# Patient Record
Sex: Female | Born: 1975
Health system: Southern US, Community
[De-identification: ages and names within clinical notes are randomized; demographics above are authoritative.]

## PROBLEM LIST (undated history)

## (undated) DIAGNOSIS — T7840XA Allergy, unspecified, initial encounter: Secondary | ICD-10-CM

## (undated) HISTORY — DX: Allergy, unspecified, initial encounter: T78.40XA

---

## 1998-10-15 ENCOUNTER — Emergency Department (HOSPITAL_COMMUNITY): Admission: EM | Admit: 1998-10-15 | Discharge: 1998-10-15 | Payer: Self-pay | Admitting: Emergency Medicine

## 1999-01-24 ENCOUNTER — Emergency Department (HOSPITAL_COMMUNITY): Admission: EM | Admit: 1999-01-24 | Discharge: 1999-01-24 | Payer: Self-pay | Admitting: Emergency Medicine

## 2001-03-22 ENCOUNTER — Emergency Department (HOSPITAL_COMMUNITY): Admission: EM | Admit: 2001-03-22 | Discharge: 2001-03-22 | Payer: Self-pay | Admitting: Emergency Medicine

## 2001-03-22 ENCOUNTER — Encounter: Payer: Self-pay | Admitting: Emergency Medicine

## 2008-05-11 ENCOUNTER — Ambulatory Visit: Payer: Self-pay | Admitting: Family Medicine

## 2010-02-20 ENCOUNTER — Ambulatory Visit: Payer: Self-pay | Admitting: Family Medicine

## 2010-03-05 ENCOUNTER — Ambulatory Visit: Payer: Self-pay | Admitting: Family Medicine

## 2010-03-05 ENCOUNTER — Other Ambulatory Visit: Admission: RE | Admit: 2010-03-05 | Discharge: 2010-03-05 | Payer: Self-pay | Admitting: Family Medicine

## 2010-05-05 LAB — HM PAP SMEAR: HM Pap smear: NEGATIVE

## 2011-06-10 ENCOUNTER — Encounter: Payer: Self-pay | Admitting: Family Medicine

## 2015-11-13 ENCOUNTER — Encounter: Payer: Self-pay | Admitting: Family Medicine

## 2015-11-13 ENCOUNTER — Ambulatory Visit (INDEPENDENT_AMBULATORY_CARE_PROVIDER_SITE_OTHER): Payer: 59 | Admitting: Family Medicine

## 2015-11-13 VITALS — BP 122/80 | HR 84 | Resp 14 | Wt 127.0 lb

## 2015-11-13 DIAGNOSIS — R1084 Generalized abdominal pain: Secondary | ICD-10-CM | POA: Diagnosis not present

## 2015-11-13 LAB — POCT URINALYSIS DIPSTICK
Bilirubin, UA: NEGATIVE
Glucose, UA: NEGATIVE
Ketones, UA: NEGATIVE
Leukocytes, UA: NEGATIVE
Nitrite, UA: NEGATIVE
Protein, UA: NEGATIVE
Spec Grav, UA: >=1.03
Urobilinogen, UA: NEGATIVE
pH, UA: 6

## 2015-11-13 NOTE — Progress Notes (Signed)
   Subjective:    Patient ID: [facility], female    DOB: 08/02/1976, 39 y.o.   MRN: [id number]  HPI She complains of a one-month history of intermittent lower abdominal pain that in the last 2 days as gotten much worse. The pain lasts for several minutes and then goes away. She has had no nausea, vomiting, diarrhea or constipation. Her menses has been normal. She's had no urgency or frequency.   Review of Systems     Objective:   Physical Exam Alert and in no distress. Abdominal exam shows active bowel sounds without masses or tenderness. Urinalysis did show slight amount of RBCs however she recently finished her cycle.       Assessment & Plan:  Generalized abdominal pain - Plan: POCT urinalysis dipstick  I explained that at this time there is no reason behind her pain. Recommend 2 Aleve twice per day for the next week to see if this will help him pay attention to her symptom complex.

## 2015-11-13 NOTE — Patient Instructions (Signed)
Take 2 Aleve twice a day regularly for the next week. Attention to what when and where and why

## 2017-06-16 ENCOUNTER — Ambulatory Visit (INDEPENDENT_AMBULATORY_CARE_PROVIDER_SITE_OTHER): Payer: 59 | Admitting: Family Medicine

## 2017-06-16 ENCOUNTER — Encounter: Payer: Self-pay | Admitting: Family Medicine

## 2017-06-16 VITALS — BP 120/76 | HR 100 | Wt 116.0 lb

## 2017-06-16 DIAGNOSIS — K148 Other diseases of tongue: Secondary | ICD-10-CM | POA: Diagnosis not present

## 2017-06-16 DIAGNOSIS — L309 Dermatitis, unspecified: Secondary | ICD-10-CM | POA: Diagnosis not present

## 2017-06-16 DIAGNOSIS — J309 Allergic rhinitis, unspecified: Secondary | ICD-10-CM | POA: Diagnosis not present

## 2017-06-16 NOTE — Progress Notes (Signed)
   Subjective:    Patient ID: [facility]ri [facility], female    DOB: 1976/08/23, 41 y.o.   MRN: [id number]  HPI She is here for evaluation of dry patches on her elbows and torso. She has a previous history of eczema but this appears differently. She also has a history of allergic rhinitis. She also has a lesion present on her tongue that tends to come and go. His causing her no difficulty.   Review of Systems     Objective:   Physical Exam Alert and in no distress. Exam of the tongue does show a whitish 2-3 mm lesion on the midportion of the tongue. No other lesions noted. Exam of her elbows in the antecubital fossa and also over the elbow does show some pigment changes. She also has round slightly pigmented areas on her torso.      Assessment & Plan:  Eczema, unspecified type  Allergic rhinitis, unspecified seasonality, unspecified trigger  Tongue lesion I explained that I think the lesions are eczema and recommended treating with cortisone cream aggressively initially and then backing off to keep it under control. She is comfortable with this approach. I stated that since the tongue lesion tends to come and go much less concerned did mention possibly seeing her dentist concerning his thoughts.

## 2018-07-25 ENCOUNTER — Ambulatory Visit: Payer: 59 | Admitting: Family Medicine

## 2018-07-25 ENCOUNTER — Other Ambulatory Visit (HOSPITAL_COMMUNITY)
Admission: RE | Admit: 2018-07-25 | Discharge: 2018-07-25 | Disposition: A | Discharge status: Home / Self Care | Payer: 59 | Source: Ambulatory Visit | Attending: Family Medicine | Admitting: Family Medicine

## 2018-07-25 ENCOUNTER — Encounter: Payer: Self-pay | Admitting: Family Medicine

## 2018-07-25 VITALS — BP 140/70 | HR 102 | Temp 98.1°F | Wt 122.0 lb

## 2018-07-25 DIAGNOSIS — J309 Allergic rhinitis, unspecified: Secondary | ICD-10-CM | POA: Diagnosis not present

## 2018-07-25 DIAGNOSIS — Z124 Encounter for screening for malignant neoplasm of cervix: Secondary | ICD-10-CM

## 2018-07-25 DIAGNOSIS — Z0000 Encounter for general adult medical examination without abnormal findings: Secondary | ICD-10-CM

## 2018-07-25 DIAGNOSIS — Z23 Encounter for immunization: Secondary | ICD-10-CM

## 2018-07-25 DIAGNOSIS — L309 Dermatitis, unspecified: Secondary | ICD-10-CM

## 2018-07-25 DIAGNOSIS — Z1239 Encounter for other screening for malignant neoplasm of breast: Secondary | ICD-10-CM

## 2018-07-25 DIAGNOSIS — Z1231 Encounter for screening mammogram for malignant neoplasm of breast: Secondary | ICD-10-CM

## 2018-07-25 NOTE — Progress Notes (Signed)
   Subjective:    Patient ID: Terri Mahoney, female    DOB: [birth year], 42 y.o.   MRN: [id number]  HPI She is here for complete examination.  She does have eczema and takes care of that with OTC medications.  She also has allergies and again is using OTC meds for that.  She has no other concerns or complaints.  Plan social history as well as health maintenance and immunizations was reviewed.  She is not involved in a regular exercise program.  Her work is going well.  Presently she is not in a relationship.   Review of Systems  All other systems reviewed and are negative.      Objective:   Physical Exam BP 140/70 (BP Location: Left Arm, Patient Position: Sitting)   Pulse (!) 102   Temp 98.1 F (36.7 C)   Wt 122 lb (55.3 kg)   SpO2 99%   General Appearance:    Alert, cooperative, no distress, appears stated age  Head:    Normocephalic, without obvious abnormality, atraumatic  Eyes:    PERRL, conjunctiva/corneas clear, EOM's intact, fundi    benign  Ears:    Normal TM's and external ear canals  Nose:   Nares normal, mucosa normal, no drainage or sinus   tenderness  Throat:   Lips, mucosa, and tongue normal; teeth and gums normal  Neck:   Supple, no lymphadenopathy;  thyroid:  no   enlargement/tenderness/nodules; no carotid   bruit or JVD     Lungs:     Clear to auscultation bilaterally without wheezes, rales or     ronchi; respirations unlabored      Heart:    Regular rate and rhythm, S1 and S2 normal, no murmur, rub   or gallop  Breast Exam:   Deferred  Abdomen:     Soft, non-tender, nondistended, normoactive bowel sounds,    no masses, no hepatosplenomegaly  Genitalia:    Normal external genitalia without lesions.  BUS and vagina normal; cervix was not visualized, or cervical motion tenderness. No abnormal vaginal discharge.  Uterus and adnexa not enlarged, nontender, no masses.  Hymen intact pap performed  Rectal:   Deferred  Extremities:   No clubbing, cyanosis or edema   Pulses:   2+ and symmetric all extremities  Skin:   Skin color, texture, turgor normal, no rashes or lesions  Lymph nodes:   Cervical, supraclavicular, and axillary nodes normal  Neurologic:   CNII-XII intact, normal strength, sensation and gait; reflexes 2+ and symmetric throughout          Psych:   Normal mood, affect, hygiene and grooming.         Assessment & Plan:  Routine general medical examination at a health care facility - Plan: CBC with Differential/Platelet, Comprehensive metabolic panel, Lipid panel  Eczema, unspecified type  Allergic rhinitis, unspecified seasonality, unspecified trigger  Need for Tdap vaccination - Plan: Tdap vaccine greater than or equal to 7yo IM  Screening for cervical cancer - Plan: Cytology - PAP([facility])  Screening for breast cancer - Plan: MM DIGITAL SCREENING BILATERAL I encouraged her to continue to take good care of herself.

## 2018-07-26 LAB — COMPREHENSIVE METABOLIC PANEL
ALT: 11 IU/L (ref 0–32)
AST: 17 IU/L (ref 0–40)
Albumin/Globulin Ratio: 1.4 (ref 1.2–2.2)
Albumin: 4.4 g/dL (ref 3.5–5.5)
Alkaline Phosphatase: 45 IU/L (ref 39–117)
BUN/Creatinine Ratio: 15 (ref 9–23)
BUN: 12 mg/dL (ref 6–24)
Bilirubin Total: 0.2 mg/dL (ref 0.0–1.2)
CO2: 24 mmol/L (ref 20–29)
Calcium: 9.4 mg/dL (ref 8.7–10.2)
Chloride: 101 mmol/L (ref 96–106)
Creatinine, Ser: 0.79 mg/dL (ref 0.57–1.00)
GFR calc Af Amer: 108 mL/min/{1.73_m2} (ref >59)
GFR calc non Af Amer: 93 mL/min/{1.73_m2} (ref >59)
Globulin, Total: 3.1 g/dL (ref 1.5–4.5)
Glucose: 106 mg/dL — ABNORMAL HIGH (ref 65–99)
Potassium: 4.8 mmol/L (ref 3.5–5.2)
Sodium: 140 mmol/L (ref 134–144)
Total Protein: 7.5 g/dL (ref 6.0–8.5)

## 2018-07-26 LAB — CBC WITH DIFFERENTIAL/PLATELET
Basophils Absolute: 0 10*3/uL (ref 0.0–0.2)
Basos: 1 %
EOS (ABSOLUTE): 0.1 10*3/uL (ref 0.0–0.4)
Eos: 1 %
Hematocrit: 37.8 % (ref 34.0–46.6)
Hemoglobin: 12.4 g/dL (ref 11.1–15.9)
Immature Grans (Abs): 0 10*3/uL (ref 0.0–0.1)
Immature Granulocytes: 0 %
Lymphocytes Absolute: 1.4 10*3/uL (ref 0.7–3.1)
Lymphs: 21 %
MCH: 25.7 pg — ABNORMAL LOW (ref 26.6–33.0)
MCHC: 32.8 g/dL (ref 31.5–35.7)
MCV: 78 fL — ABNORMAL LOW (ref 79–97)
Monocytes Absolute: 0.5 10*3/uL (ref 0.1–0.9)
Monocytes: 7 %
Neutrophils Absolute: 4.7 10*3/uL (ref 1.4–7.0)
Neutrophils: 70 %
Platelets: 323 10*3/uL (ref 150–450)
RBC: 4.82 x10E6/uL (ref 3.77–5.28)
RDW: 15.7 % — ABNORMAL HIGH (ref 12.3–15.4)
WBC: 6.6 10*3/uL (ref 3.4–10.8)

## 2018-07-26 LAB — LIPID PANEL
Chol/HDL Ratio: 3.1 ratio (ref 0.0–4.4)
Cholesterol, Total: 209 mg/dL — ABNORMAL HIGH (ref 100–199)
HDL: 67 mg/dL (ref >39)
LDL Calculated: 121 mg/dL — ABNORMAL HIGH (ref 0–99)
Triglycerides: 106 mg/dL (ref 0–149)
VLDL Cholesterol Cal: 21 mg/dL (ref 5–40)

## 2018-07-27 LAB — CYTOLOGY - PAP
Adequacy: ABSENT
Diagnosis: NEGATIVE

## 2019-07-26 ENCOUNTER — Encounter: Payer: 59 | Admitting: Family Medicine

## 2020-09-17 ENCOUNTER — Other Ambulatory Visit: Payer: Self-pay

## 2020-09-17 ENCOUNTER — Encounter: Payer: Self-pay | Admitting: Family Medicine

## 2020-09-17 ENCOUNTER — Ambulatory Visit: Payer: 59 | Admitting: Family Medicine

## 2020-09-17 VITALS — BP 118/76 | HR 91 | Temp 98.2°F | Ht 62.0 in | Wt 116.4 lb

## 2020-09-17 DIAGNOSIS — R1033 Periumbilical pain: Secondary | ICD-10-CM

## 2020-09-17 NOTE — Progress Notes (Signed)
° °  Subjective:    Patient ID: Terri Mahoney, female    DOB: 08-Mar-1976, 44 y.o.   MRN: [id number]  HPI She complains of a 3-week history of intermittent periumbilical burning sensation with nausea, weakness but no vomiting, black tarry stools.  No difficulty with this at night.  Food makes questionable difference with this.  She has not tried any medications for this.   Review of Systems     Objective:   Physical Exam Alert and in no distress.  Cardiac exam shows regular rhythm without murmurs or gallops.  Lungs are clear to auscultation.  Abdominal exam shows slight periumbilical discomfort but no rebound with normal bowel sounds.       Assessment & Plan:  Periumbilical abdominal pain Take 2 Prilosec daily for the next couple of weeks and pay attention to what, when, where, why.  Come back in a month if you need to

## 2020-09-17 NOTE — Patient Instructions (Signed)
Take 2 Prilosec daily for the next couple of weeks and pay attention to what, when, where, why.  Come back in a month if you need to

## 2020-12-10 ENCOUNTER — Encounter: Payer: 59 | Admitting: Family Medicine

## 2020-12-31 ENCOUNTER — Emergency Department (HOSPITAL_COMMUNITY)
Admission: EM | Admit: 2020-12-31 | Discharge: 2021-01-01 | Disposition: A | Discharge status: Home / Self Care | Payer: 59 | Attending: Emergency Medicine | Admitting: Emergency Medicine

## 2020-12-31 ENCOUNTER — Other Ambulatory Visit: Payer: Self-pay

## 2020-12-31 DIAGNOSIS — K921 Melena: Secondary | ICD-10-CM | POA: Diagnosis not present

## 2020-12-31 DIAGNOSIS — Z5321 Procedure and treatment not carried out due to patient leaving prior to being seen by health care provider: Secondary | ICD-10-CM | POA: Diagnosis not present

## 2020-12-31 NOTE — ED Triage Notes (Signed)
Pt states tonight she noticed BRBPR, blood in stool, blood on TP when she wiped. States she has not eaten anything to precipitate symptoms, does not know of any exacerbating factors, but has felt weak off & on for past couple months, unsure if related to menapause or something more serious.

## 2021-01-01 LAB — URINALYSIS, ROUTINE W REFLEX MICROSCOPIC
Bilirubin Urine: NEGATIVE
Glucose, UA: NEGATIVE mg/dL
Ketones, ur: NEGATIVE mg/dL
Nitrite: NEGATIVE
Protein, ur: NEGATIVE mg/dL
Specific Gravity, Urine: 1.009 (ref 1.005–1.030)
pH: 7 (ref 5.0–8.0)

## 2021-01-01 LAB — COMPREHENSIVE METABOLIC PANEL
ALT: 15 U/L (ref 0–44)
AST: 20 U/L (ref 15–41)
Albumin: 4 g/dL (ref 3.5–5.0)
Alkaline Phosphatase: 39 U/L (ref 38–126)
Anion gap: 12 (ref 5–15)
BUN: 14 mg/dL (ref 6–20)
CO2: 23 mmol/L (ref 22–32)
Calcium: 9.2 mg/dL (ref 8.9–10.3)
Chloride: 102 mmol/L (ref 98–111)
Creatinine, Ser: 0.82 mg/dL (ref 0.44–1.00)
GFR, Estimated: >60 mL/min (ref >60)
Glucose, Bld: 118 mg/dL — ABNORMAL HIGH (ref 70–99)
Potassium: 3.4 mmol/L — ABNORMAL LOW (ref 3.5–5.1)
Sodium: 137 mmol/L (ref 135–145)
Total Bilirubin: 0.6 mg/dL (ref 0.3–1.2)
Total Protein: 7.1 g/dL (ref 6.5–8.1)

## 2021-01-01 LAB — CBC
HCT: 37.5 % (ref 36.0–46.0)
Hemoglobin: 12 g/dL (ref 12.0–15.0)
MCH: 25.6 pg — ABNORMAL LOW (ref 26.0–34.0)
MCHC: 32 g/dL (ref 30.0–36.0)
MCV: 80 fL (ref 80.0–100.0)
Platelets: 266 10*3/uL (ref 150–400)
RBC: 4.69 MIL/uL (ref 3.87–5.11)
RDW: 14.6 % (ref 11.5–15.5)
WBC: 6.9 10*3/uL (ref 4.0–10.5)
nRBC: 0 % (ref 0.0–0.2)

## 2021-01-01 LAB — LIPASE, BLOOD: Lipase: 57 U/L — ABNORMAL HIGH (ref 11–51)

## 2021-01-02 ENCOUNTER — Ambulatory Visit: Payer: 59 | Admitting: Family Medicine

## 2021-01-02 ENCOUNTER — Encounter: Payer: Self-pay | Admitting: Family Medicine

## 2021-01-02 ENCOUNTER — Other Ambulatory Visit: Payer: Self-pay

## 2021-01-02 VITALS — BP 110/78 | HR 94 | Temp 96.9°F | Wt 116.2 lb

## 2021-01-02 DIAGNOSIS — R300 Dysuria: Secondary | ICD-10-CM | POA: Diagnosis not present

## 2021-01-02 DIAGNOSIS — J029 Acute pharyngitis, unspecified: Secondary | ICD-10-CM | POA: Diagnosis not present

## 2021-01-02 DIAGNOSIS — N3001 Acute cystitis with hematuria: Secondary | ICD-10-CM

## 2021-01-02 LAB — POCT URINALYSIS DIP (PROADVANTAGE DEVICE)
Bilirubin, UA: NEGATIVE
Glucose, UA: NEGATIVE mg/dL
Ketones, POC UA: NEGATIVE mg/dL
Nitrite, UA: NEGATIVE
Protein Ur, POC: 30 mg/dL — AB
Specific Gravity, Urine: 1.025
Urobilinogen, Ur: 0.2
pH, UA: 6 (ref 5.0–8.0)

## 2021-01-02 MED ORDER — SULFAMETHOXAZOLE-TRIMETHOPRIM 800-160 MG PO TABS: 1.0000 | ORAL_TABLET | Freq: Two times a day (BID) | ORAL | 0 refills | Status: DC

## 2021-01-02 NOTE — Progress Notes (Signed)
   Subjective:    Patient ID: Terri Mahoney, female    DOB: 09/15/1976, 45 y.o.   MRN: [id number]  HPI She is here for follow-up after recently being seen in the emergency room.  She initially noted blood in her stool however several hours after seeing that, she started her menstrual cycle.  She has been regular with that.  She has occasionally noted difficulty with what she calls hot flashes.  She has noted a decrease in the flow and number of days.  She has noted difficulty with dysuria and some urgency and notes this with the last several cycles.  She also has had difficulty with a sore throat for the last several months that is been intermittent in nature, no fever no chills no history of allergies.   Review of Systems     Objective:   Physical Exam Alert and in no distress. Tympanic membranes and canals are normal. Pharyngeal area is normal. Neck is supple without adenopathy or thyromegaly. Cardiac exam shows a regular sinus rhythm without murmurs or gallops. Lungs are clear to auscultation. Urine dipstick showed blood as well as leukocytes.       Assessment & Plan:  Acute cystitis with hematuria - Plan: sulfamethoxazole-trimethoprim (BACTRIM DS) 800-160 MG tablet  Burning with urination - Plan: POCT Urinalysis DIP (Proadvantage Device)  Sore throat I explained that I thought she could potentially have a UTI and will treat her since she is having the dysuria. I then discussed menopause and perimenopausal symptoms.  She will return in May for complete exam and Pap. Discussed a sore throat since she is really not having any other symptoms recommended watchful waiting.  Can refer to ENT if she so desires.

## 2021-01-16 ENCOUNTER — Ambulatory Visit: Payer: 59 | Admitting: Family Medicine

## 2021-03-25 ENCOUNTER — Ambulatory Visit: Payer: 59 | Admitting: Family Medicine

## 2021-03-25 ENCOUNTER — Encounter: Payer: Self-pay | Admitting: Family Medicine

## 2021-03-25 ENCOUNTER — Other Ambulatory Visit: Payer: Self-pay

## 2021-03-25 VITALS — BP 122/74 | HR 93 | Temp 98.2°F | Ht 61.0 in | Wt 112.6 lb

## 2021-03-25 DIAGNOSIS — Z0000 Encounter for general adult medical examination without abnormal findings: Secondary | ICD-10-CM

## 2021-03-25 DIAGNOSIS — Z1159 Encounter for screening for other viral diseases: Secondary | ICD-10-CM | POA: Diagnosis not present

## 2021-03-25 DIAGNOSIS — L309 Dermatitis, unspecified: Secondary | ICD-10-CM

## 2021-03-25 DIAGNOSIS — J309 Allergic rhinitis, unspecified: Secondary | ICD-10-CM

## 2021-03-25 DIAGNOSIS — Z1231 Encounter for screening mammogram for malignant neoplasm of breast: Secondary | ICD-10-CM

## 2021-03-25 NOTE — Progress Notes (Signed)
   Subjective:    Patient ID: Terri Mahoney, female    DOB: 04-18-1976, 46 y.o.   MRN: [id number]  HPI She is here for complete examination.  She has no particular concerns or complaints.  She does have underlying allergies and treats them periodically.  She also has eczema and treats this with OTC medications.  She does not smoke or drink.  She is not dating anyone now.  Family and social history as well as health maintenance and immunizations was reviewed.  She is not involved in a regular exercise program but does keep her self physically active.   Review of Systems  All other systems reviewed and are negative.      Objective:   Physical Exam Alert and in no distress. Tympanic membranes and canals are normal. Pharyngeal area is normal. Neck is supple without adenopathy or thyromegaly. Cardiac exam shows a regular sinus rhythm without murmurs or gallops. Lungs are clear to auscultation. Abdominal exam shows no masses or tenderness with normal bowel sounds.       Assessment & Plan:  Routine general medical examination at a health care facility - Plan: CBC with Differential/Platelet, Comprehensive metabolic panel, Lipid panel  Allergic rhinitis, unspecified seasonality, unspecified trigger  Eczema, unspecified type  Need for hepatitis C screening test - Plan: Hepatitis C antibody  Encounter for screening mammogram for malignant neoplasm of breast - Plan: MM DIGITAL SCREENING BILATERAL Encouraged her to continue to take good care of her self.  She does keep himself relatively physically active.

## 2021-03-26 LAB — LIPID PANEL
Chol/HDL Ratio: 3.1 ratio (ref 0.0–4.4)
Cholesterol, Total: 222 mg/dL — ABNORMAL HIGH (ref 100–199)
HDL: 72 mg/dL (ref >39)
LDL Chol Calc (NIH): 140 mg/dL — ABNORMAL HIGH (ref 0–99)
Triglycerides: 57 mg/dL (ref 0–149)
VLDL Cholesterol Cal: 10 mg/dL (ref 5–40)

## 2021-03-26 LAB — COMPREHENSIVE METABOLIC PANEL
ALT: 13 IU/L (ref 0–32)
AST: 17 IU/L (ref 0–40)
Albumin/Globulin Ratio: 1.7 (ref 1.2–2.2)
Albumin: 4.8 g/dL (ref 3.8–4.8)
Alkaline Phosphatase: 43 IU/L — ABNORMAL LOW (ref 44–121)
BUN/Creatinine Ratio: 15 (ref 9–23)
BUN: 14 mg/dL (ref 6–24)
Bilirubin Total: 0.4 mg/dL (ref 0.0–1.2)
CO2: 22 mmol/L (ref 20–29)
Calcium: 9.6 mg/dL (ref 8.7–10.2)
Chloride: 103 mmol/L (ref 96–106)
Creatinine, Ser: 0.92 mg/dL (ref 0.57–1.00)
Globulin, Total: 2.8 g/dL (ref 1.5–4.5)
Glucose: 99 mg/dL (ref 65–99)
Potassium: 4.6 mmol/L (ref 3.5–5.2)
Sodium: 141 mmol/L (ref 134–144)
Total Protein: 7.6 g/dL (ref 6.0–8.5)
eGFR: 79 mL/min/{1.73_m2} (ref >59)

## 2021-03-26 LAB — CBC WITH DIFFERENTIAL/PLATELET
Basophils Absolute: 0 10*3/uL (ref 0.0–0.2)
Basos: 1 %
EOS (ABSOLUTE): 0.1 10*3/uL (ref 0.0–0.4)
Eos: 1 %
Hematocrit: 39.6 % (ref 34.0–46.6)
Hemoglobin: 12.9 g/dL (ref 11.1–15.9)
Immature Grans (Abs): 0 10*3/uL (ref 0.0–0.1)
Immature Granulocytes: 0 %
Lymphocytes Absolute: 1.1 10*3/uL (ref 0.7–3.1)
Lymphs: 22 %
MCH: 25.5 pg — ABNORMAL LOW (ref 26.6–33.0)
MCHC: 32.6 g/dL (ref 31.5–35.7)
MCV: 78 fL — ABNORMAL LOW (ref 79–97)
Monocytes Absolute: 0.4 10*3/uL (ref 0.1–0.9)
Monocytes: 8 %
Neutrophils Absolute: 3.5 10*3/uL (ref 1.4–7.0)
Neutrophils: 68 %
Platelets: 279 10*3/uL (ref 150–450)
RBC: 5.05 x10E6/uL (ref 3.77–5.28)
RDW: 14.9 % (ref 11.7–15.4)
WBC: 5.1 10*3/uL (ref 3.4–10.8)

## 2021-03-26 LAB — HEPATITIS C ANTIBODY: Hep C Virus Ab: <0.1 s/co ratio (ref 0.0–0.9)

## 2021-09-01 ENCOUNTER — Ambulatory Visit: Payer: 59 | Admitting: Family Medicine

## 2021-09-12 ENCOUNTER — Ambulatory Visit: Payer: 59 | Admitting: Family Medicine

## 2021-09-12 ENCOUNTER — Other Ambulatory Visit: Payer: Self-pay

## 2021-09-12 ENCOUNTER — Encounter: Payer: Self-pay | Admitting: Family Medicine

## 2021-09-12 VITALS — BP 110/70 | HR 86 | Temp 98.0°F | Wt 109.4 lb

## 2021-09-12 DIAGNOSIS — Z124 Encounter for screening for malignant neoplasm of cervix: Secondary | ICD-10-CM

## 2021-09-12 NOTE — Progress Notes (Signed)
   Subjective:    Patient ID: Terri Mahoney, female    DOB: 06/26/1976, 44 y.o.   MRN: [id number]  HPI She is here for a Pap smear today.   Review of Systems     Objective:   Physical Exam Pap smear carried out with some difficulty viewing the os.       Assessment & Plan:  Pap smear for cervical cancer screening

## 2021-09-15 ENCOUNTER — Other Ambulatory Visit (HOSPITAL_COMMUNITY)
Admission: RE | Admit: 2021-09-15 | Discharge: 2021-09-15 | Disposition: A | Discharge status: Home / Self Care | Payer: 59 | Source: Ambulatory Visit | Attending: Family Medicine | Admitting: Family Medicine

## 2021-09-15 DIAGNOSIS — Z124 Encounter for screening for malignant neoplasm of cervix: Secondary | ICD-10-CM | POA: Diagnosis not present

## 2021-09-15 LAB — RESULTS CONSOLE HPV: CHL HPV: NEGATIVE

## 2021-09-15 NOTE — Addendum Note (Signed)
Addended by: Renelda Loma on: 09/15/2021 07:52 AM   Modules accepted: Orders

## 2021-09-17 LAB — CYTOLOGY - PAP: Diagnosis: NEGATIVE

## 2021-12-25 ENCOUNTER — Other Ambulatory Visit: Payer: Self-pay

## 2021-12-25 ENCOUNTER — Encounter: Payer: Self-pay | Admitting: Family Medicine

## 2021-12-25 ENCOUNTER — Ambulatory Visit: Payer: 59 | Admitting: Family Medicine

## 2021-12-25 VITALS — BP 112/70 | HR 105 | Temp 97.7°F | Wt 106.6 lb

## 2021-12-25 DIAGNOSIS — R109 Unspecified abdominal pain: Secondary | ICD-10-CM | POA: Diagnosis not present

## 2021-12-25 DIAGNOSIS — M545 Low back pain, unspecified: Secondary | ICD-10-CM | POA: Diagnosis not present

## 2021-12-25 NOTE — Progress Notes (Signed)
° °  Subjective:    Patient ID: Terri Mahoney, female    DOB: [birth year], 46 y.o.   MRN: [id number]  HPI She is here for consult concerning a several month history of intermittent abdominal distress.  She cannot relate this to any particular foods.  The pain only lasts for 2 minutes and then is gone.  She describes it as sharp but no associated nausea, vomiting, constipation, diarrhea, urinary symptoms. She also has a 48-month history of intermittent low back pain.  The pain gets worse with sitting.  No numbness, tingling, weakness.  Motion causes no difficulty.   Review of Systems     Objective:   Physical Exam Alert and in no distress.  Abdominal exam shows no masses or tenderness with normal bowel sounds back exam shows no palpable tenderness with normal lumbar curve and motion.  No SI joint or sciatic notch issues.       Assessment & Plan:  Low back pain without sciatica, unspecified back pain laterality, unspecified chronicity  Abdominal pain, unspecified abdominal location I explained without more specific information concerning the back or abdominal pain, difficult to further assess this.  Did recommend proper posturing since sitting does play a role with her back pain.  No medications needed as neither of these occurred for an extended period of time he would benefit from pain meds

## 2022-01-13 ENCOUNTER — Ambulatory Visit
Admission: RE | Admit: 2022-01-13 | Discharge: 2022-01-13 | Disposition: A | Discharge status: Home / Self Care | Payer: 59 | Source: Ambulatory Visit | Attending: Family Medicine | Admitting: Family Medicine

## 2022-01-14 ENCOUNTER — Other Ambulatory Visit: Payer: Self-pay | Admitting: Family Medicine

## 2022-01-14 DIAGNOSIS — R928 Other abnormal and inconclusive findings on diagnostic imaging of breast: Secondary | ICD-10-CM

## 2022-01-29 ENCOUNTER — Other Ambulatory Visit: Payer: Self-pay | Admitting: Family Medicine

## 2022-01-29 ENCOUNTER — Other Ambulatory Visit: Payer: Self-pay

## 2022-01-29 ENCOUNTER — Ambulatory Visit
Admission: RE | Admit: 2022-01-29 | Discharge: 2022-01-29 | Disposition: A | Discharge status: Home / Self Care | Payer: 59 | Source: Ambulatory Visit | Attending: Family Medicine | Admitting: Family Medicine

## 2022-01-29 DIAGNOSIS — N6320 Unspecified lump in the left breast, unspecified quadrant: Secondary | ICD-10-CM

## 2022-08-04 ENCOUNTER — Ambulatory Visit
Admission: RE | Admit: 2022-08-04 | Discharge: 2022-08-04 | Disposition: A | Discharge status: Home / Self Care | Payer: 59 | Source: Ambulatory Visit | Attending: Family Medicine | Admitting: Family Medicine

## 2022-08-04 ENCOUNTER — Other Ambulatory Visit: Payer: Self-pay | Admitting: Family Medicine

## 2022-08-04 DIAGNOSIS — N6320 Unspecified lump in the left breast, unspecified quadrant: Secondary | ICD-10-CM

## 2022-11-28 ENCOUNTER — Other Ambulatory Visit: Payer: Self-pay

## 2022-11-28 ENCOUNTER — Emergency Department (HOSPITAL_COMMUNITY)
Admission: EM | Admit: 2022-11-28 | Discharge: 2022-11-28 | Disposition: A | Discharge status: Home / Self Care | Payer: 59 | Attending: Emergency Medicine | Admitting: Emergency Medicine

## 2022-11-28 DIAGNOSIS — Z77098 Contact with and (suspected) exposure to other hazardous, chiefly nonmedicinal, chemicals: Secondary | ICD-10-CM | POA: Diagnosis present

## 2022-11-28 LAB — CBC
HCT: 35.3 % — ABNORMAL LOW (ref 36.0–46.0)
Hemoglobin: 11.5 g/dL — ABNORMAL LOW (ref 12.0–15.0)
MCH: 26 pg (ref 26.0–34.0)
MCHC: 32.6 g/dL (ref 30.0–36.0)
MCV: 79.7 fL — ABNORMAL LOW (ref 80.0–100.0)
Platelets: 209 10*3/uL (ref 150–400)
RBC: 4.43 MIL/uL (ref 3.87–5.11)
RDW: 14.6 % (ref 11.5–15.5)
WBC: 6.5 10*3/uL (ref 4.0–10.5)
nRBC: 0 % (ref 0.0–0.2)

## 2022-11-28 LAB — COMPREHENSIVE METABOLIC PANEL
ALT: 18 U/L (ref 0–44)
AST: 22 U/L (ref 15–41)
Albumin: 4.2 g/dL (ref 3.5–5.0)
Alkaline Phosphatase: 34 U/L — ABNORMAL LOW (ref 38–126)
Anion gap: 8 (ref 5–15)
BUN: 19 mg/dL (ref 6–20)
CO2: 23 mmol/L (ref 22–32)
Calcium: 8.6 mg/dL — ABNORMAL LOW (ref 8.9–10.3)
Chloride: 104 mmol/L (ref 98–111)
Creatinine, Ser: 0.81 mg/dL (ref 0.44–1.00)
GFR, Estimated: >60 mL/min (ref >60)
Glucose, Bld: 172 mg/dL — ABNORMAL HIGH (ref 70–99)
Potassium: 3.4 mmol/L — ABNORMAL LOW (ref 3.5–5.1)
Sodium: 135 mmol/L (ref 135–145)
Total Bilirubin: 0.5 mg/dL (ref 0.3–1.2)
Total Protein: 7.4 g/dL (ref 6.5–8.1)

## 2022-11-28 MED ORDER — ONDANSETRON 4 MG PO TBDP: 4.0000 mg | ORAL_TABLET | Freq: Once | ORAL | Status: DC | PRN

## 2022-11-28 NOTE — Discharge Instructions (Signed)
Return to ED with any new or worsening signs or symptoms such as trouble breathing or shortness of breath Please follow-up with your PCP as needed Please begin taking Zofran for nausea Please begin taking ibuprofen or Tylenol for sore throat

## 2022-11-28 NOTE — ED Triage Notes (Addendum)
Presents with [facility]. On Monday smelled a rag with muriatic acid. Initially had nasal irritation and sore throat and has since developed nausea and fatigue.  States the exposure was brief, few seconds  Denies blood in vomit, SOB  EMS VS: 118/72, 78bpm, 20, 99% RA, 114CBG

## 2022-11-28 NOTE — ED Provider Notes (Signed)
[facility] DEPT Provider Note   CSN: [id number] Arrival date & time: 11/28/22  0036     History  Chief Complaint  Patient presents with   Chemical Exposure    MAIMOUNA RONDEAU is a 47 y.o. female with medical history of allergies.  Patient presents to ED for evaluation of possible chemical exposure.  Patient reports that on Monday she was in her father's work vehicle when she picked up a rag and was unsure what it was.  Patient reports that she then smelled the rag for 1 to 2 seconds and then developed nose irritation, sore throat and cough.  Patient reports that the rag had muriatic acid on it.  Patient reports since this time she has developed nausea and fatigue.  Patient denies any blood in vomit, blood in stool, shortness of breath, new onset of rashes, trouble swallowing, fevers.  Patient denies that she has been in contact with or exposed to anyone with flu, COVID or RSV.  HPI     Home Medications Prior to Admission medications   Medication Sig Start Date End Date Taking? Authorizing Provider  Multiple Vitamin (MULTIVITAMIN) tablet Take 1 tablet by mouth daily.    [provider]      Allergies    Vicodin [hydrocodone-acetaminophen]    Review of Systems   Review of Systems  Constitutional:  Positive for fatigue. Negative for fever.  HENT:  Positive for sore throat. Negative for trouble swallowing.   Respiratory:  Negative for shortness of breath.   Cardiovascular:  Negative for chest pain.  Gastrointestinal:  Positive for nausea and vomiting. Negative for abdominal pain, blood in stool and diarrhea.  Skin:  Negative for rash.  All other systems reviewed and are negative.   Physical Exam Updated Vital Signs BP 112/69 (BP Location: Right Arm)   Pulse 84   Temp 98.2 F (36.8 C) (Oral)   Resp 18   Ht 5\' 1"  (1.549 m)   Wt 48.1 kg   LMP 10/21/2022 (Approximate)   SpO2 100%   BMI 20.03 kg/m  Physical Exam Vitals and  nursing note reviewed.  Constitutional:      General: She is not in acute distress.    Appearance: She is well-developed.  HENT:     Head: Normocephalic and atraumatic.     Mouth/Throat:     Mouth: Mucous membranes are moist.     Pharynx: Oropharynx is clear. Posterior oropharyngeal erythema present. No oropharyngeal exudate.  Eyes:     Conjunctiva/sclera: Conjunctivae normal.  Cardiovascular:     Rate and Rhythm: Normal rate and regular rhythm.     Heart sounds: No murmur heard. Pulmonary:     Effort: Pulmonary effort is normal. No respiratory distress.     Breath sounds: Normal breath sounds. No wheezing or rales.  Abdominal:     Palpations: Abdomen is soft.     Tenderness: There is no abdominal tenderness.  Musculoskeletal:        General: No swelling.     Cervical back: Neck supple.  Skin:    General: Skin is warm and dry.     Capillary Refill: Capillary refill takes less than 2 seconds.  Neurological:     Mental Status: She is alert and oriented to person, place, and time.  Psychiatric:        Mood and Affect: Mood normal.     ED Results / Procedures / Treatments   Labs (all labs ordered are listed, but only abnormal  results are displayed) Labs Reviewed  COMPREHENSIVE METABOLIC PANEL - Abnormal; Notable for the following components:      Result Value   Potassium 3.4 (*)    Glucose, Bld 172 (*)    Calcium 8.6 (*)    Alkaline Phosphatase 34 (*)    All other components within normal limits  CBC - Abnormal; Notable for the following components:   Hemoglobin 11.5 (*)    HCT 35.3 (*)    MCV 79.7 (*)    All other components within normal limits    EKG None  Radiology No results found.  Procedures Procedures   Medications Ordered in ED Medications  ondansetron (ZOFRAN-ODT) disintegrating tablet 4 mg (has no administration in time range)    ED Course/ Medical Decision Making/ A&P                           Medical Decision Making Amount and/or  Complexity of Data Reviewed Labs: ordered.  Risk Prescription drug management.   47 year old female presents to the ED for evaluation after chemical exposure.  Please see HPI for further details.  On examination patient afebrile and nontachycardic.  Patient lung sounds are clear bilaterally, she is not hypoxic on room air.  Patient abdomen soft and compressible throughout.  Patient neurological examination shows no focal neurodeficits.  The patient posterior oropharynx is erythematous without exudate.  The patient uvula is midline, she is handling secretions appropriately.  There is no drooling, there is no change in phonation.  No sign of RPA, PTA.  Patient states that her exposure occurred on Monday.  The patient since then has had no shortness of breath, trouble swallowing, fevers, rash.  Patient overall well-appearing.  The patient was offered viral testing however she deferred on this.  The patient will be sent home with Zofran and advised to treat symptoms at home.  Patient given return precautions and she voiced understanding.  Patient had all questions answered to her satisfaction.  Patient stable for discharge.   Final Clinical Impression(s) / ED Diagnoses Final diagnoses:  Chemical exposure    Rx / DC Orders ED Discharge Orders     None         Azucena Cecil, PA-C 11/28/22 0901    Blanchie Dessert, MD 11/28/22 [id number]

## 2022-11-30 ENCOUNTER — Telehealth: Payer: Self-pay | Admitting: Family Medicine

## 2022-11-30 NOTE — Telephone Encounter (Signed)
Transition Care Management Unsuccessful Follow-up Telephone Call  Date of discharge and from where:  11/28/2022   Attempts:  1st Attempt  Reason for unsuccessful TCM follow-up call:  Left voice message

## 2022-12-01 ENCOUNTER — Telehealth: Payer: Self-pay | Admitting: Family Medicine

## 2022-12-01 NOTE — Telephone Encounter (Signed)
Transition Care Management Unsuccessful Follow-up Telephone Call  Date of discharge and from where:  11/28/2022 [facility]  Attempts:  2nd Attempt  Reason for unsuccessful TCM follow-up call:  Left voice message

## 2022-12-01 NOTE — Telephone Encounter (Signed)
Transition Care Management Follow-up Telephone Call Date of discharge and from where: 11/28/2022 [facility]  How have you been since you were released from the hospital? [facility] no lasting effects Any questions or concerns? No  Items Reviewed: Did the pt receive and understand the discharge instructions provided? Yes  Medications obtained and verified? No pt did not receive any medications Other? No  Any new allergies since your discharge? No  Dietary orders reviewed? No Do you have support at home? Yes   Home Care and Equipment/Supplies: Were home health services ordered? not applicable Follow up appointments reviewed:  PCP Hospital f/u appt confirmed? Pt states she is not having any issues not lasting effects follow up not needed If their condition worsens, is the pt aware to call PCP or go to the Emergency Dept.? Yes Was the patient provided with contact information for the PCP's office or ED? Yes Was to pt encouraged to call back with questions or concerns? Yes

## 2023-01-14 ENCOUNTER — Ambulatory Visit
Admission: RE | Admit: 2023-01-14 | Discharge: 2023-01-14 | Disposition: A | Discharge status: Home / Self Care | Payer: 59 | Source: Ambulatory Visit | Attending: Family Medicine | Admitting: Family Medicine

## 2023-01-14 DIAGNOSIS — N6320 Unspecified lump in the left breast, unspecified quadrant: Secondary | ICD-10-CM

## 2023-04-21 ENCOUNTER — Ambulatory Visit: Payer: 59 | Admitting: Family Medicine

## 2023-04-21 ENCOUNTER — Encounter: Payer: Self-pay | Admitting: Family Medicine

## 2023-04-21 VITALS — BP 112/68 | HR 91 | Temp 98.3°F | Resp 14 | Wt 103.2 lb

## 2023-04-21 DIAGNOSIS — K219 Gastro-esophageal reflux disease without esophagitis: Secondary | ICD-10-CM

## 2023-04-21 NOTE — Patient Instructions (Signed)
Gastroesophageal Reflux Disease, Adult Gastroesophageal reflux (GER) happens when acid from the stomach flows up into the tube that connects the mouth and the stomach (esophagus). Normally, food travels down the esophagus and stays in the stomach to be digested. However, when a person has GER, food and stomach acid sometimes move back up into the esophagus. If this becomes a more serious problem, the person may be diagnosed with a disease called gastroesophageal reflux disease (GERD). GERD occurs when the reflux: Happens often. Causes frequent or severe symptoms. Causes problems such as damage to the esophagus. When stomach acid comes in contact with the esophagus, the acid may cause inflammation in the esophagus. Over time, GERD may create small holes (ulcers) in the lining of the esophagus. What are the causes? This condition is caused by a problem with the muscle between the esophagus and the stomach (lower esophageal sphincter, or LES). Normally, the LES muscle closes after food passes through the esophagus to the stomach. When the LES is weakened or abnormal, it does not close properly, and that allows food and stomach acid to go back up into the esophagus. The LES can be weakened by certain dietary substances, medicines, and medical conditions, including: Tobacco use. Pregnancy. Having a hiatal hernia. Alcohol use. Certain foods and beverages, such as coffee, chocolate, onions, and peppermint. What increases the risk? You are more likely to develop this condition if you: Have an increased body weight. Have a connective tissue disorder. Take NSAIDs, such as ibuprofen. What are the signs or symptoms? Symptoms of this condition include: Heartburn. Difficult or painful swallowing and the feeling of having a lump in the throat. A bitter taste in the mouth. Bad breath and having a large amount of saliva. Having an upset or bloated stomach and belching. Chest pain. Different conditions can  cause chest pain. Make sure you see your health care provider if you experience chest pain. Shortness of breath or wheezing. Ongoing (chronic) cough or a nighttime cough. Wearing away of tooth enamel. Weight loss. How is this diagnosed? This condition may be diagnosed based on a medical history and a physical exam. To determine if you have mild or severe GERD, your health care provider may also monitor how you respond to treatment. You may also have tests, including: A test to examine your stomach and esophagus with a small camera (endoscopy). A test that measures the acidity level in your esophagus. A test that measures how much pressure is on your esophagus. A barium swallow or modified barium swallow test to show the shape, size, and functioning of your esophagus. How is this treated? Treatment for this condition may vary depending on how severe your symptoms are. Your health care provider may recommend: Changes to your diet. Medicine. Surgery. The goal of treatment is to help relieve your symptoms and to prevent complications. Follow these instructions at home: Eating and drinking  Follow a diet as recommended by your health care provider. This may involve avoiding foods and drinks such as: Coffee and tea, with or without caffeine. Drinks that contain alcohol. Energy drinks and sports drinks. Carbonated drinks or sodas. Chocolate and cocoa. Peppermint and mint flavorings. Garlic and onions. Horseradish. Spicy and acidic foods, including peppers, chili powder, curry powder, vinegar, hot sauces, and barbecue sauce. Citrus fruit juices and citrus fruits, such as oranges, lemons, and limes. Tomato-based foods, such as red sauce, chili, salsa, and pizza with red sauce. Fried and fatty foods, such as donuts, french fries, potato chips, and high-fat dressings.  High-fat meats, such as hot dogs and fatty cuts of red and white meats, such as rib eye steak, sausage, ham, and  bacon. High-fat dairy items, such as whole milk, butter, and cream cheese. Eat small, frequent meals instead of large meals. Avoid drinking large amounts of liquid with your meals. Avoid eating meals during the 2-3 hours before bedtime. Avoid lying down right after you eat. Do not exercise right after you eat. Lifestyle  Do not use any products that contain nicotine or tobacco. These products include cigarettes, chewing tobacco, and vaping devices, such as e-cigarettes. If you need help quitting, ask your health care provider. Try to reduce your stress by using methods such as yoga or meditation. If you need help reducing stress, ask your health care provider. If you are overweight, reduce your weight to an amount that is healthy for you. Ask your health care provider for guidance about a safe weight loss goal. General instructions Pay attention to any changes in your symptoms. Take over-the-counter and prescription medicines only as told by your health care provider. Do not take aspirin, ibuprofen, or other NSAIDs unless your health care provider told you to take these medicines. Wear loose-fitting clothing. Do not wear anything tight around your waist that causes pressure on your abdomen. Raise (elevate) the head of your bed about 6 inches (15 cm). You can use a wedge to do this. Avoid bending over if this makes your symptoms worse. Keep all follow-up visits. This is important. Contact a health care provider if: You have: New symptoms. Unexplained weight loss. Difficulty swallowing or it hurts to swallow. Wheezing or a persistent cough. A hoarse voice. Your symptoms do not improve with treatment. Get help right away if: You have sudden pain in your arms, neck, jaw, teeth, or back. You suddenly feel sweaty, dizzy, or light-headed. You have chest pain or shortness of breath. You vomit and the vomit is green, yellow, or black, or it looks like blood or coffee grounds. You faint. You  have stool that is red, bloody, or black. You cannot swallow, drink, or eat. These symptoms may represent a serious problem that is an emergency. Do not wait to see if the symptoms will go away. Get medical help right away. Call your local emergency services (911 in the [place] Do not drive yourself to the hospital. Summary Gastroesophageal reflux happens when acid from the stomach flows up into the esophagus. GERD is a disease in which the reflux happens often, causes frequent or severe symptoms, or causes problems such as damage to the esophagus. Treatment for this condition may vary depending on how severe your symptoms are. Your health care provider may recommend diet and lifestyle changes, medicine, or surgery. Contact a health care provider if you have new or worsening symptoms. Take over-the-counter and prescription medicines only as told by your health care provider. Do not take aspirin, ibuprofen, or other NSAIDs unless your health care provider told you to do so. Keep all follow-up visits as told by your health care provider. This is important. This information is not intended to replace advice given to you by your health care provider. Make sure you discuss any questions you have with your health care provider.  Try over-the-counter Prilosec regularly for the next couple of weeks to see if this will help to go away.  If it does then you can use it intermittently after that but pay attention to anything that makes it better or worse and respond appropriately Document Revised:  05/13/2020 Document Reviewed: 05/13/2020 Elsevier Patient Education  2024 [employer].

## 2023-04-21 NOTE — Progress Notes (Signed)
   Subjective:    Patient ID: Terri Mahoney, female    DOB: 02-Mar-1976, 47 y.o.   MRN: [id number]  HPI She complains of a 1 year history of intermittent acid indigestion that occurs usually at night.  She also complains of intermittent abdominal pain and gas.  She has not tried to see if there is any correlation between his and the food she eats or when she eats her last meal states that she rarely eats late at night.  She does have concerns over hiatus hernia since she saw this on [employer].   Review of Systems     Objective:   Physical Exam Alert and in no distress.  Cardiac exam shows regular rhythm without murmur gallop.  Lungs are clear to auscultation.  Abdominal exam shows no masses or tenderness.       Assessment & Plan:  Gastroesophageal reflux disease, unspecified whether esophagitis present Try over-the-counter Prilosec regularly for the next couple of weeks to see if this will help to go away.  If it does then you can use it intermittently after that but pay attention to anything that makes it better or worse and respond appropriately.  If it does not improve, we can do further evaluation and possibly refer to gastroenterology.

## 2023-06-30 DIAGNOSIS — Z0000 Encounter for general adult medical examination without abnormal findings: Secondary | ICD-10-CM | POA: Insufficient documentation

## 2023-07-01 ENCOUNTER — Encounter: Payer: Self-pay | Admitting: Family Medicine

## 2023-07-01 ENCOUNTER — Ambulatory Visit: Payer: 59 | Admitting: Family Medicine

## 2023-07-01 VITALS — BP 120/62 | HR 87 | Temp 98.1°F | Resp 16 | Ht 61.0 in | Wt 102.2 lb

## 2023-07-01 DIAGNOSIS — J301 Allergic rhinitis due to pollen: Secondary | ICD-10-CM | POA: Diagnosis not present

## 2023-07-01 DIAGNOSIS — Z0000 Encounter for general adult medical examination without abnormal findings: Secondary | ICD-10-CM

## 2023-07-01 DIAGNOSIS — L308 Other specified dermatitis: Secondary | ICD-10-CM | POA: Diagnosis not present

## 2023-07-01 DIAGNOSIS — Z1211 Encounter for screening for malignant neoplasm of colon: Secondary | ICD-10-CM | POA: Diagnosis not present

## 2023-07-01 NOTE — Progress Notes (Signed)
   Complete physical exam  Patient: Terri Mahoney   DOB: [birth year]   47 y.o. Female  MRN: [id number]  Subjective:    Chief Complaint  Patient presents with   Annual Exam    CPE. Non fasting.     Terri Mahoney is a 47 y.o. female who presents today for a complete physical exam.  She reports consuming a general diet. The patient has a physically strenuous job, but has no regular exercise apart from work.  She generally feels fairly well. She reports sleeping fairly well.  Her allergies and asthma seem to be under good control.  She does not smoke or drink.  He is not sexually active.  Work is going well. recent fall risk assessment:    04/21/2023    3:31 PM  Fall Risk   Falls in the past year? 0  Number falls in past yr: 0  Injury with Fall? 0  Risk for fall due to : No Fall Risks  Follow up Falls evaluation completed     Most recent depression screenings:    07/01/2023   10:34 AM 04/21/2023    3:31 PM  PHQ 2/9 Scores  PHQ - 2 Score 0 0    Vision:Not within last year  and Dental: Receives regular dental care    Immunization History  Administered Date(s) Administered   PFIZER(Purple Top)SARS-COV-2 Vaccination 04/19/2020, 05/17/2020, 11/20/2020   Tdap 07/25/2018    Health Maintenance  Topic Date Due   HIV Screening  Never done   Colonoscopy  Never done   COVID-19 Vaccine (4 - 2023-24 season) 07/17/2022   INFLUENZA VACCINE  06/17/2023   PAP SMEAR-Modifier  09/15/2026   DTaP/Tdap/Td (2 - Td or Tdap) 07/25/2028   Hepatitis C Screening  Completed   HPV VACCINES  Aged Out    Patient Care Team: Ronnald Nian, MD as PCP - General (Family Medicine)   Outpatient Medications Prior to Visit  Medication Sig   Multiple Vitamin (MULTIVITAMIN) tablet Take 1 tablet by mouth daily.   No facility-administered medications prior to visit.    Review of Systems  All other systems reviewed and are negative.   Family and social history as well as health maintenance  and immunizations was reviewed.     Objective:    BP 120/62   Pulse 87   Temp 98.1 F (36.7 C) (Oral)   Resp 16   Ht 5\' 1"  (1.549 m)   Wt 102 lb 3.2 oz (46.4 kg)   LMP 06/04/2023   SpO2 99% Comment: room air  BMI 19.31 kg/m    Physical Exam  Alert and in no distress. Tympanic membranes and canals are normal. Pharyngeal area is normal. Neck is supple without adenopathy or thyromegaly. Cardiac exam shows a regular sinus rhythm without murmurs or gallops. Lungs are clear to auscultation.      Assessment & Plan:    Routine general medical examination at a health care facility - Plan: CBC with Differential/Platelet, Comprehensive metabolic panel, Lipid panel  Non-seasonal allergic rhinitis due to pollen  Other eczema  Screening for colon cancer - Plan: Cologuard   Encouraged her to continue in the care of her self.  Recheck here 1 year.     Sharlot Gowda, MD

## 2023-07-02 LAB — CBC WITH DIFFERENTIAL/PLATELET
Basophils Absolute: 0 10*3/uL (ref 0.0–0.2)
Basos: 0 %
EOS (ABSOLUTE): 0 10*3/uL (ref 0.0–0.4)
Eos: 1 %
Hematocrit: 38.6 % (ref 34.0–46.6)
Hemoglobin: 12.8 g/dL (ref 11.1–15.9)
Immature Grans (Abs): 0 10*3/uL (ref 0.0–0.1)
Immature Granulocytes: 0 %
Lymphocytes Absolute: 0.9 10*3/uL (ref 0.7–3.1)
Lymphs: 19 %
MCH: 26.3 pg — ABNORMAL LOW (ref 26.6–33.0)
MCHC: 33.2 g/dL (ref 31.5–35.7)
MCV: 79 fL (ref 79–97)
Monocytes Absolute: 0.4 10*3/uL (ref 0.1–0.9)
Monocytes: 9 %
Neutrophils Absolute: 3.4 10*3/uL (ref 1.4–7.0)
Neutrophils: 71 %
Platelets: 228 10*3/uL (ref 150–450)
RBC: 4.87 x10E6/uL (ref 3.77–5.28)
RDW: 15.4 % (ref 11.7–15.4)
WBC: 4.8 10*3/uL (ref 3.4–10.8)

## 2023-07-02 LAB — LIPID PANEL
Chol/HDL Ratio: 2.8 ratio (ref 0.0–4.4)
Cholesterol, Total: 219 mg/dL — ABNORMAL HIGH (ref 100–199)
HDL: 77 mg/dL (ref >39)
LDL Chol Calc (NIH): 131 mg/dL — ABNORMAL HIGH (ref 0–99)
Triglycerides: 64 mg/dL (ref 0–149)
VLDL Cholesterol Cal: 11 mg/dL (ref 5–40)

## 2023-07-02 LAB — COMPREHENSIVE METABOLIC PANEL
ALT: 15 IU/L (ref 0–32)
AST: 19 IU/L (ref 0–40)
Albumin: 4.6 g/dL (ref 3.9–4.9)
Alkaline Phosphatase: 45 IU/L (ref 44–121)
BUN/Creatinine Ratio: 16 (ref 9–23)
BUN: 14 mg/dL (ref 6–24)
Bilirubin Total: 0.4 mg/dL (ref 0.0–1.2)
CO2: 22 mmol/L (ref 20–29)
Calcium: 9.9 mg/dL (ref 8.7–10.2)
Chloride: 102 mmol/L (ref 96–106)
Creatinine, Ser: 0.85 mg/dL (ref 0.57–1.00)
Globulin, Total: 2.7 g/dL (ref 1.5–4.5)
Glucose: 89 mg/dL (ref 70–99)
Potassium: 5.1 mmol/L (ref 3.5–5.2)
Sodium: 139 mmol/L (ref 134–144)
Total Protein: 7.3 g/dL (ref 6.0–8.5)
eGFR: 86 mL/min/{1.73_m2} (ref >59)

## 2023-08-14 LAB — COLOGUARD: COLOGUARD: NEGATIVE

## 2024-02-07 IMAGING — MG MM DIGITAL DIAGNOSTIC UNILAT*L* W/ TOMO W/ CAD
4 series · 4 of 12 positions shown · non-contrast
Comparison: Baseline mammogram 01/13/2022.

CLINICAL DATA: Recall from baseline screening mammography, possible
asymmetry in the inner subareolar LEFT breast visible only on the CC
view.

EXAM:
DIGITAL DIAGNOSTIC UNILATERAL LEFT MAMMOGRAM WITH TOMOSYNTHESIS AND
CAD; ULTRASOUND LEFT BREAST LIMITED
TECHNIQUE: Left digital diagnostic mammography and breast tomosynthesis was
performed. The images were evaluated with computer-aided detection.;
Targeted ultrasound examination of the left breast was performed.

[L CC synth-2D]
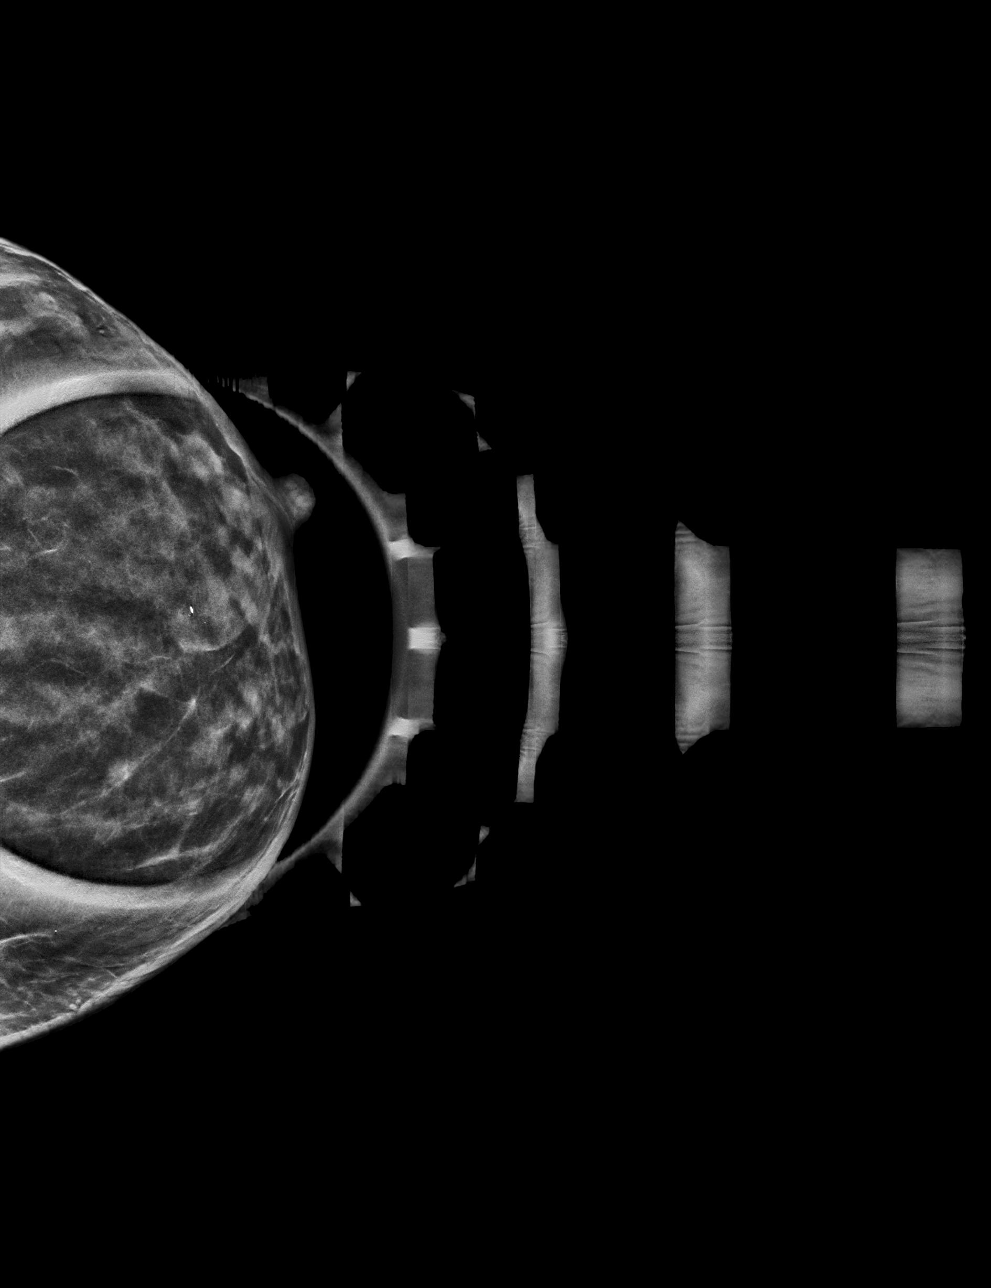

[L ML synth-2D]
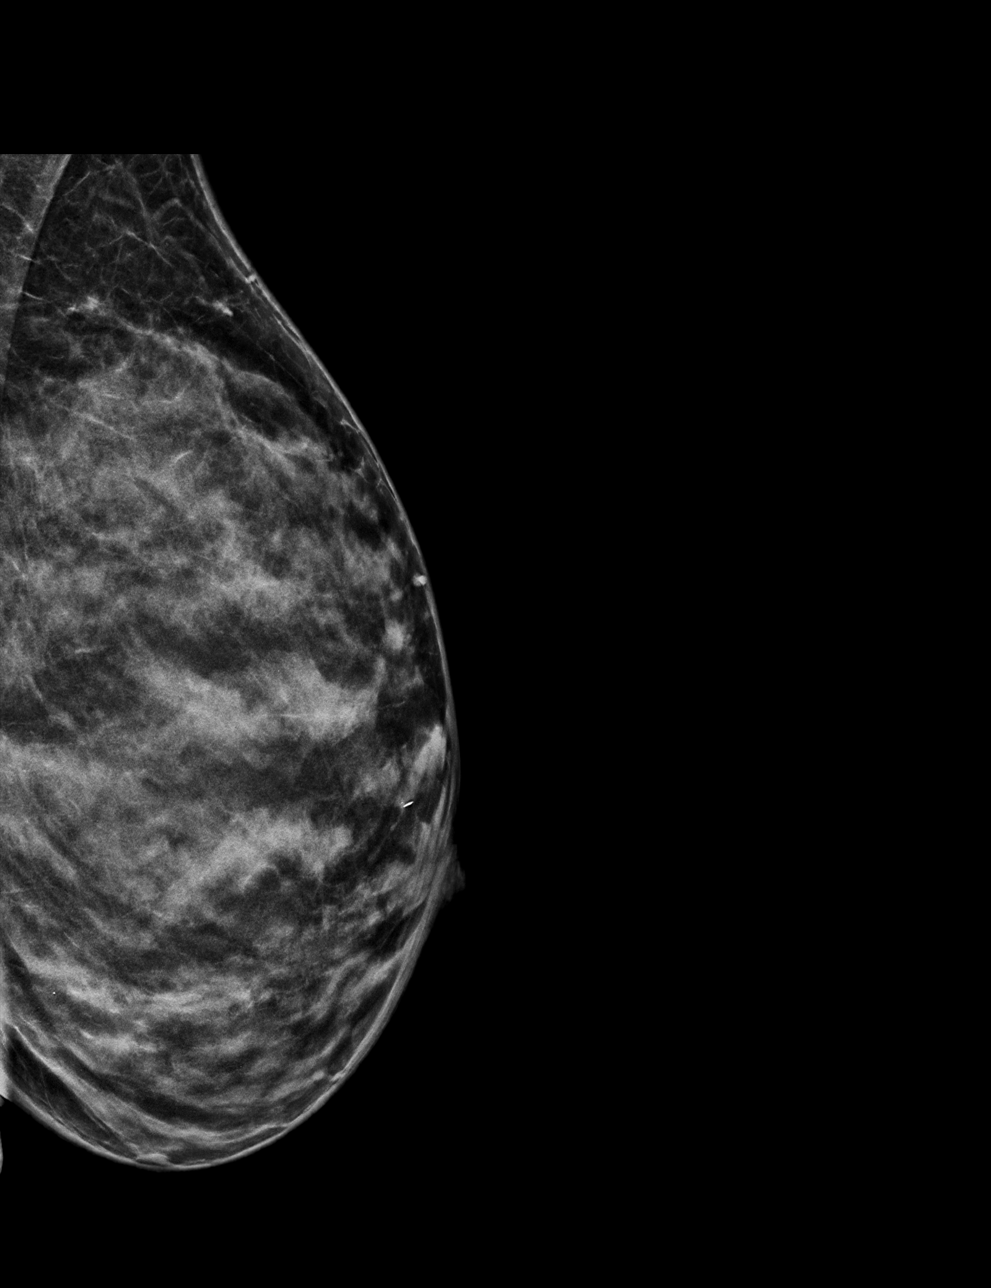

[L CC tomo · tomo slice 23/46.0]
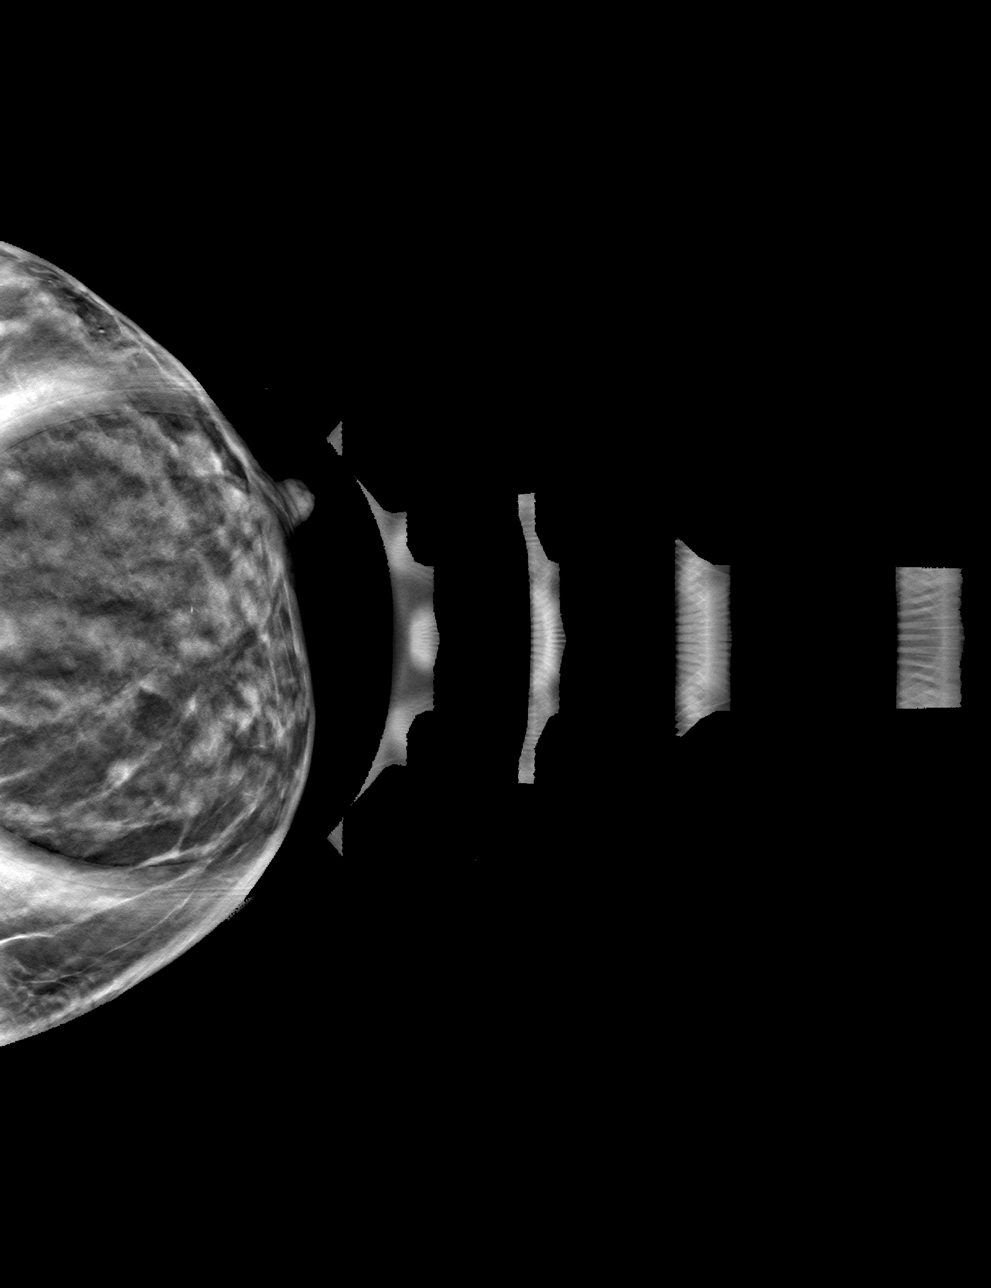

[L ML tomo · tomo slice 27/54.0]
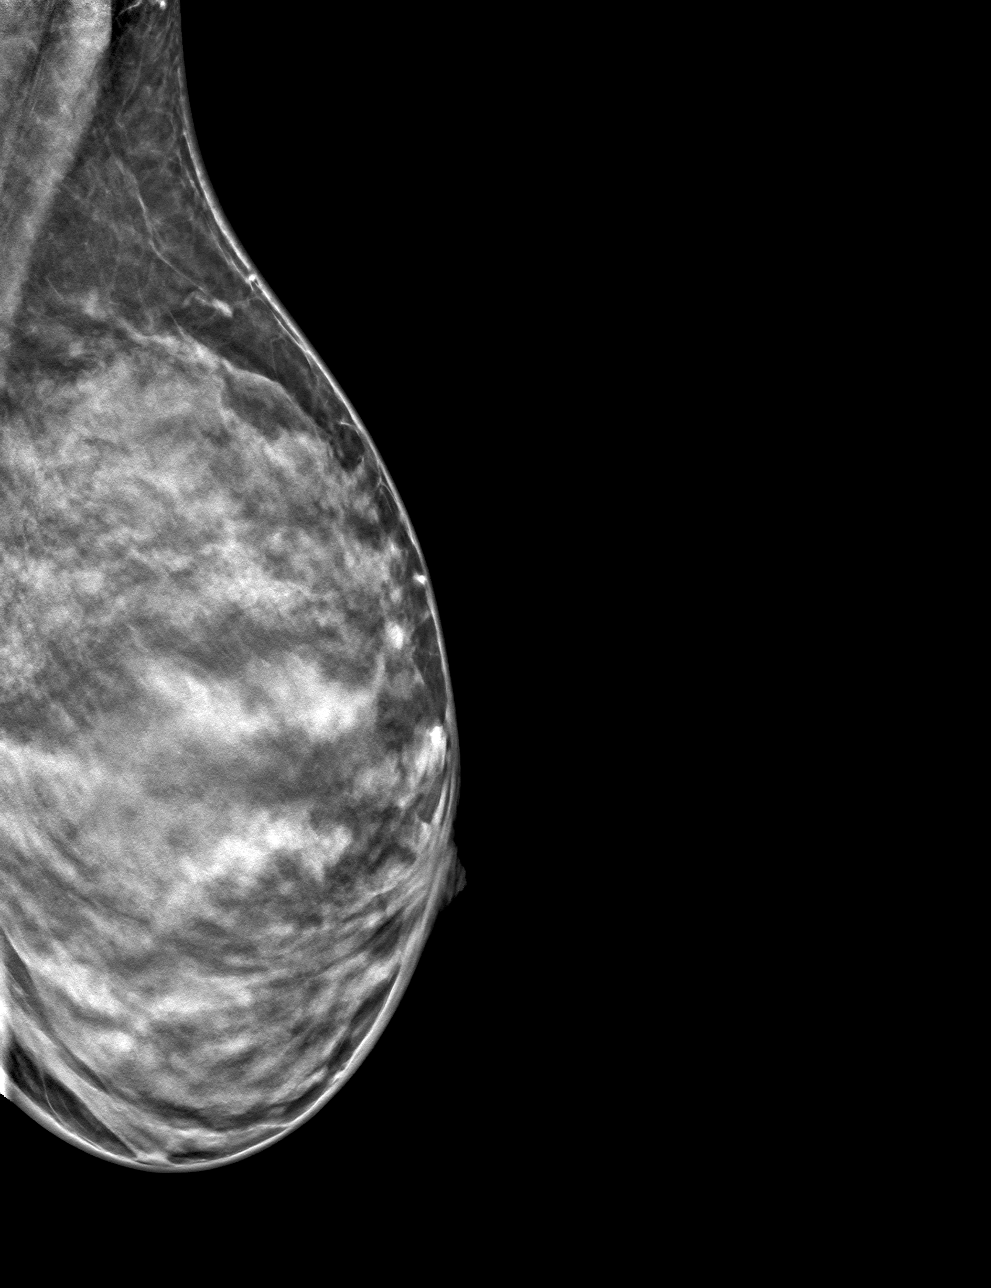

[4 of 12 positions shown; findings below may reference images not displayed]

No prior ultrasound.

ACR Breast Density Category d: The breast tissue is extremely dense,
which lowers the sensitivity of mammography.
FINDINGS: Spot-compression CC view of the area of concern and a full field
mediolateral view were obtained.

The asymmetry in the inner subareolar location persists on the spot
compression view. The asymmetry localizes nearly directly behind the
nipple on the mediolateral view.

Due to the extreme breast density, targeted ultrasound is performed
in the subareolar location, demonstrating an oval circumscribed
parallel hypoechoic mass superficially at the 11 o'clock position 1
cm from the nipple measuring approximately 2.2 x 0.7 x 1.3 cm,
demonstrating posterior acoustic enhancement and demonstrating
peripheral internal power Doppler flow, corresponding to the
screening mammographic finding.
IMPRESSION: Likely benign 2.2 cm mass in the upper inner subareolar location at
11 o'clock 1 cm from the nipple, likely a fibroadenoma.

RECOMMENDATION:
We discussed management options for the likely benign fibroadenoma
including excision, ultrasound-guided core biopsy, and short term
interval follow-up. Follow-up ultrasound is recommended at 6, 12 and
24 months to assess stability. The patient agrees with this plan.

I have discussed the findings and recommendations with the patient.
If applicable, a reminder letter will be sent to the patient
regarding the next appointment.

BI-RADS CATEGORY  3: Probably benign.

## 2024-02-07 IMAGING — US US BREAST*L* LIMITED INC AXILLA
1 series · 7 of 7 positions shown · non-contrast
Comparison: Baseline mammogram 01/13/2022.

CLINICAL DATA: Recall from baseline screening mammography, possible
asymmetry in the inner subareolar LEFT breast visible only on the CC
view.

EXAM:
DIGITAL DIAGNOSTIC UNILATERAL LEFT MAMMOGRAM WITH TOMOSYNTHESIS AND
CAD; ULTRASOUND LEFT BREAST LIMITED
TECHNIQUE: Left digital diagnostic mammography and breast tomosynthesis was
performed. The images were evaluated with computer-aided detection.;
Targeted ultrasound examination of the left breast was performed.

[Series 1: us breast*left* limited inc axilla · 0.06mm/px · 7 of 7 slices shown]
[im 1/7]
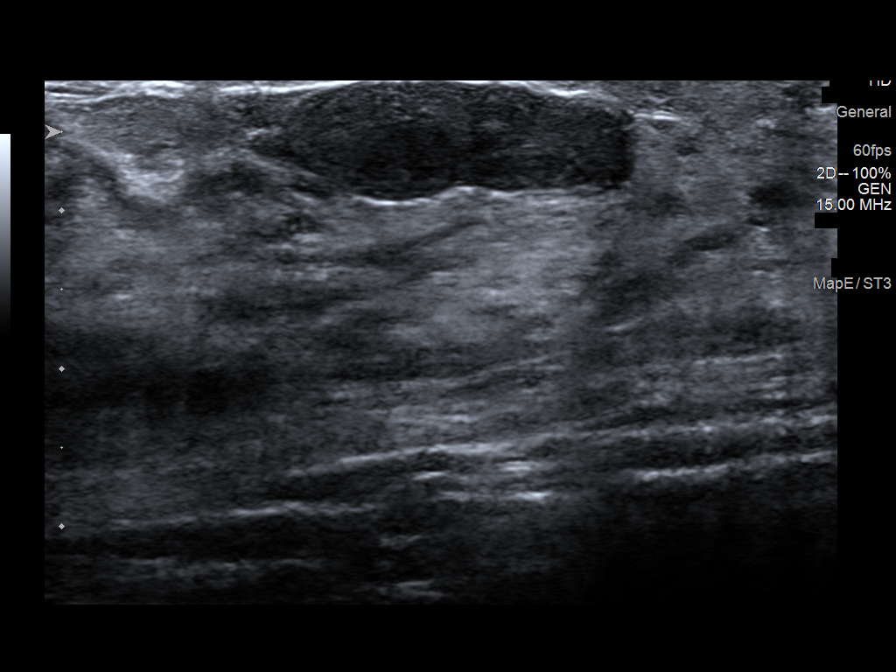
[im 2/7]
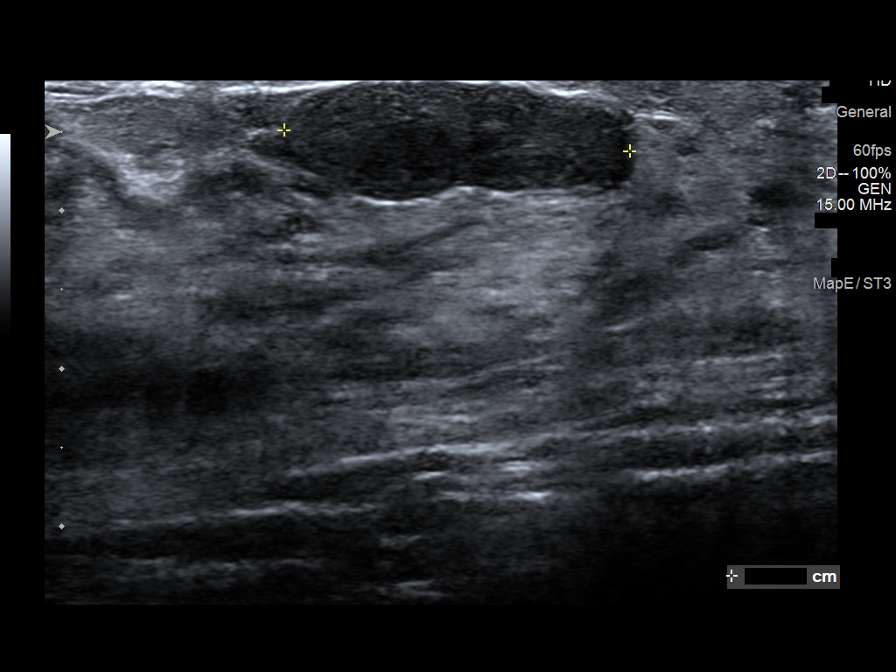
[im 3/7]
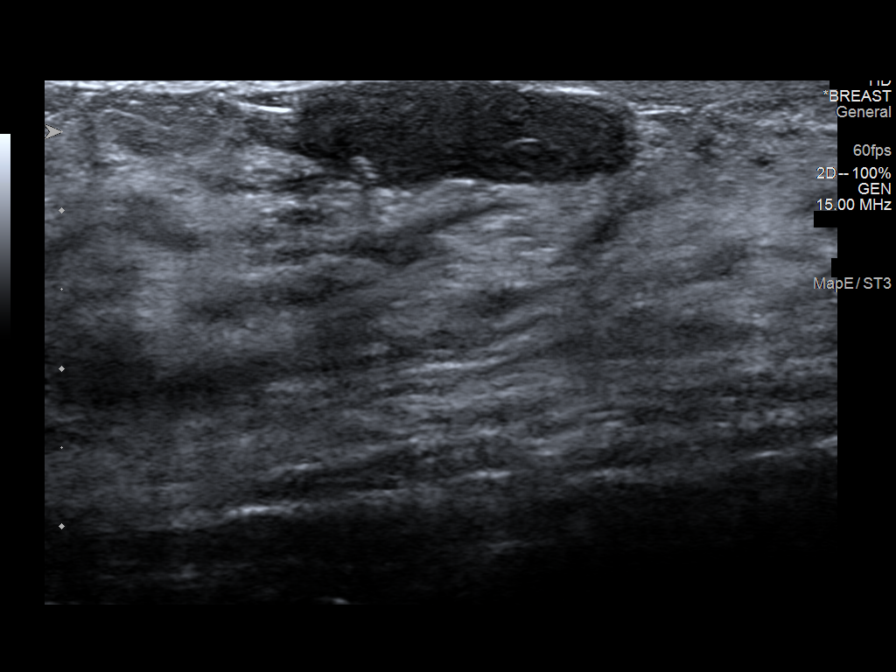
[im 4/7]
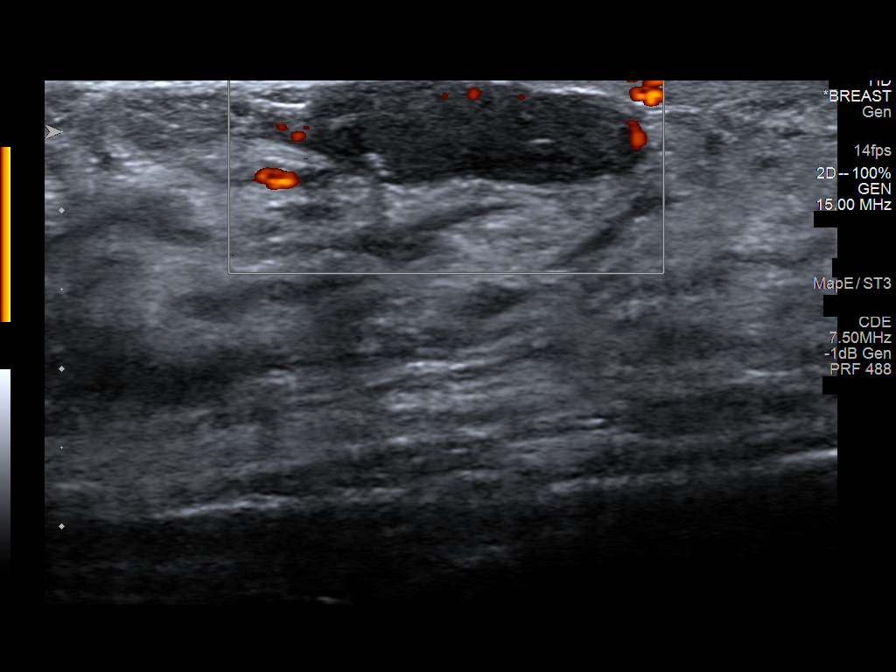
[im 5/7]
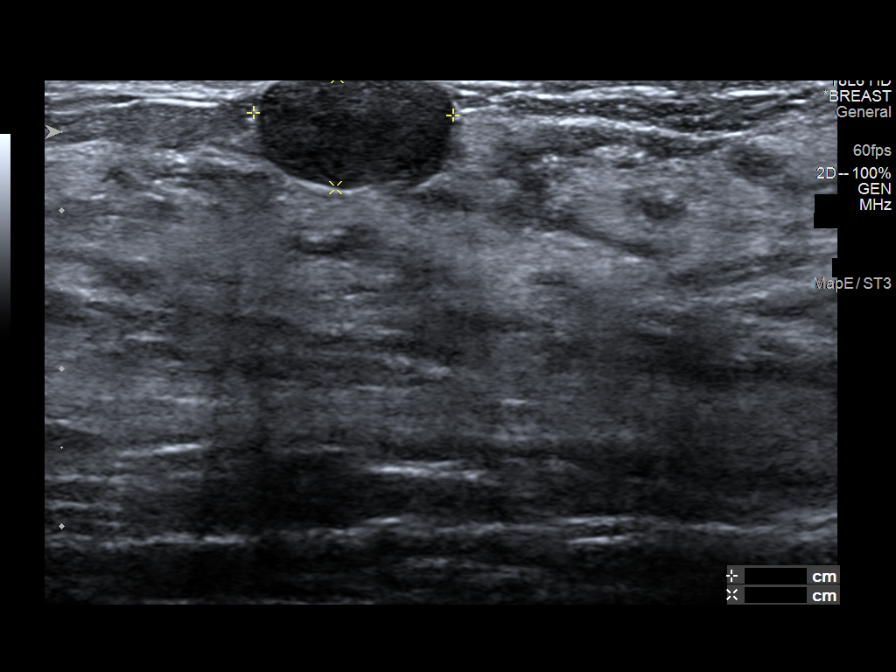
[im 6/7]
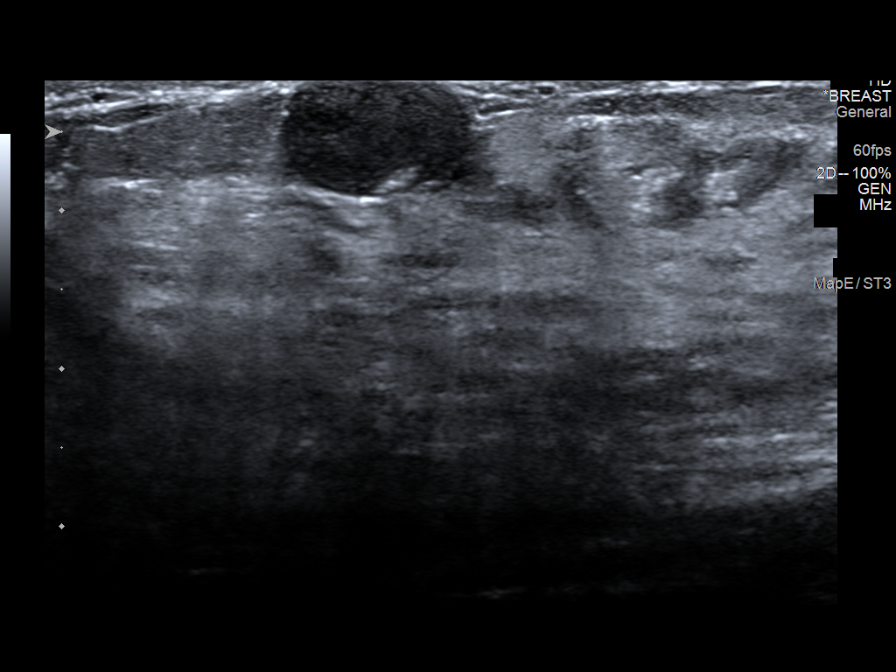
[im 7/7]
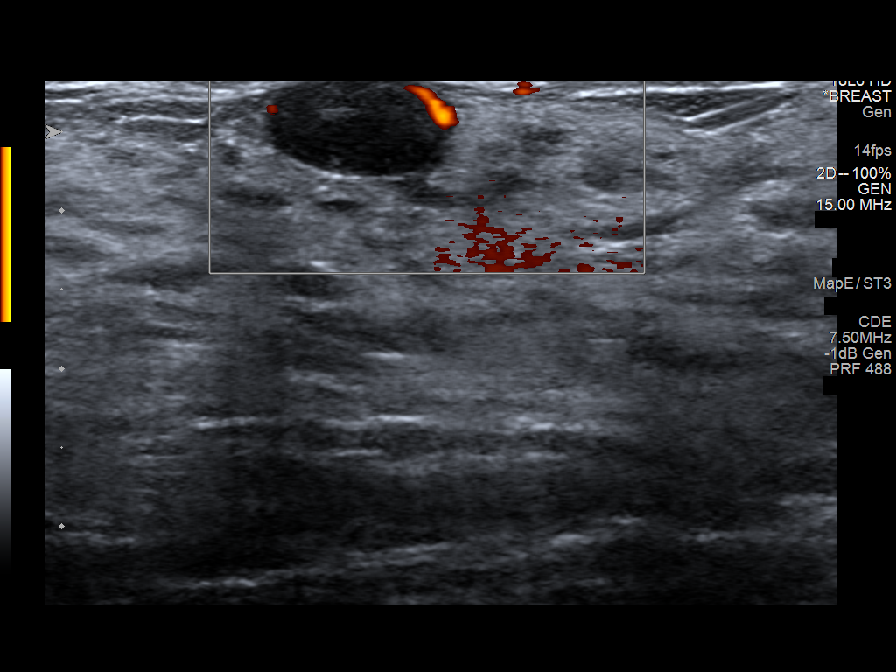

[7 of 7 positions shown; findings below may reference images not displayed]

No prior ultrasound.

ACR Breast Density Category d: The breast tissue is extremely dense,
which lowers the sensitivity of mammography.
FINDINGS: Spot-compression CC view of the area of concern and a full field
mediolateral view were obtained.

The asymmetry in the inner subareolar location persists on the spot
compression view. The asymmetry localizes nearly directly behind the
nipple on the mediolateral view.

Due to the extreme breast density, targeted ultrasound is performed
in the subareolar location, demonstrating an oval circumscribed
parallel hypoechoic mass superficially at the 11 o'clock position 1
cm from the nipple measuring approximately 2.2 x 0.7 x 1.3 cm,
demonstrating posterior acoustic enhancement and demonstrating
peripheral internal power Doppler flow, corresponding to the
screening mammographic finding.
IMPRESSION: Likely benign 2.2 cm mass in the upper inner subareolar location at
11 o'clock 1 cm from the nipple, likely a fibroadenoma.

RECOMMENDATION:
We discussed management options for the likely benign fibroadenoma
including excision, ultrasound-guided core biopsy, and short term
interval follow-up. Follow-up ultrasound is recommended at 6, 12 and
24 months to assess stability. The patient agrees with this plan.

I have discussed the findings and recommendations with the patient.
If applicable, a reminder letter will be sent to the patient
regarding the next appointment.

BI-RADS CATEGORY  3: Probably benign.

## 2024-06-22 ENCOUNTER — Encounter: Payer: Self-pay | Admitting: Family Medicine

## 2024-06-22 ENCOUNTER — Ambulatory Visit: Payer: Self-pay

## 2024-06-22 ENCOUNTER — Telehealth (INDEPENDENT_AMBULATORY_CARE_PROVIDER_SITE_OTHER): Admitting: Family Medicine

## 2024-06-22 VITALS — Wt 97.0 lb

## 2024-06-22 DIAGNOSIS — U071 COVID-19: Secondary | ICD-10-CM | POA: Diagnosis not present

## 2024-06-22 MED ORDER — NIRMATRELVIR/RITONAVIR (PAXLOVID)TABLET: 3.0000 | ORAL_TABLET | Freq: Two times a day (BID) | ORAL | 0 refills | Status: AC

## 2024-06-22 NOTE — Progress Notes (Signed)
   Subjective:    Patient ID: Terri Mahoney, female    DOB: Dec 16, 1975, 48 y.o.   MRN: [id number]  HPI Documentation for virtual audio and video telecommunications through Caregility encounter:  The patient was located at home. 2 patient identifiers used.  The provider was located in the office. The patient did consent to this visit and is aware of possible charges through their insurance for this visit.  The other persons participating in this telemedicine service were none. Time spent on call was 5 minutes and in review of previous records >15 minutes total for counseling and coordination of care.  This virtual service is not related to other E/M service within previous 7 days.  She states that yesterday she developed rhinorrhea, sore throat, slight cough, headache and myalgias.  Her parents of both recently had COVID and are now recovering from it.  They were given Paxlovid .  She tested this morning and the test for COVID was positive.  Review of Systems     Objective:    Physical Exam Alert and in no distress otherwise not examined.  Her breathing pattern appears normal.       Assessment & Plan:  COVID-19 Discussed treatment of COVID in terms of fever, increasing cough, shortness of breath as reasons to get more aggressive.  She would like to have Paxil bid called in in case she gets worse.  Explained that it is okay to wait.  Once she has turned the corner and is feeling better, she can then go out in public with a mask for about 5 days.  She is comfortable with all this.

## 2024-06-22 NOTE — Telephone Encounter (Signed)
 FYI Only or Action Required?: Action required by provider: medication refill request and update on patient condition. Request for paxlovid  to be called into pharmacy  Patient was last seen in primary care on 07/01/2023 by Joyce Norleen BROCKS, MD.  Called Nurse Triage reporting Covid Positive, Sore Throat, Cough, Headache, and Nasal Congestion.  Symptoms began yesterday.  Interventions attempted: Rest, hydration, or home remedies.  Symptoms are: unchanged.  Triage Disposition: Home Care  Patient/caregiver understands and will follow disposition?: Yes  Message from Terri Mahoney sent at 06/22/2024 10:59 AM EDT  Summary: positive covid test   Reason for Triage: positive covid test, patient called in to advise she tested positive for covid-19 this morning. Patient is experiencing sore throat, slight chills, had headache yesterday, and stuff nose. Patient would like to know if she can get medication.         Reason for Disposition  [1] COVID-19 diagnosed by doctor (or NP/PA) AND [2] mild symptoms (e.g., cough, fever, others) AND [3] no complications or SOB  Answer Assessment - Initial Assessment Questions 1. COVID-19 DIAGNOSIS: How do you know that you have COVID? (e.g., positive lab test or self-test, diagnosed by doctor or NP/PA, symptoms after exposure).     Home test today 2. COVID-19 EXPOSURE: Was there any known exposure to COVID before the symptoms began? CDC Definition of close contact: within 6 feet (2 meters) for a total of 15 minutes or more over a 24-hour period.      Positive, patient's parents tested positive 4 days ago, symptoms started seven days ago 3. ONSET: When did the COVID-19 symptoms start?      One day ago 4. WORST SYMPTOM: What is your worst symptom? (e.g., cough, fever, shortness of breath, muscle aches)     Runny nose and cough 5. COUGH: Do you have a cough? If Yes, ask: How bad is the cough?       Minimal cough 6. FEVER: Do you have a fever? If Yes,  ask: What is your temperature, how was it measured, and when did it start?     Patient has had chills 7. RESPIRATORY STATUS: Describe your breathing? (e.g., normal; shortness of breath, wheezing, unable to speak)      denies 8. BETTER-SAME-WORSE: Are you getting better, staying the same or getting worse compared to yesterday?  If getting worse, ask, In what way?     same 9. OTHER SYMPTOMS: Do you have any other symptoms?  (e.g., chills, fatigue, headache, loss of smell or taste, muscle pain, sore throat)     Positive for Chills, head ache, sore throat 10. HIGH RISK DISEASE: Do you have any chronic medical problems? (e.g., asthma, heart or lung disease, weak immune system, obesity, etc.)       negative 11. VACCINE: Have you had the COVID-19 vaccine? If Yes, ask: Which one, how many shots, when did you get it?      Yes, but has been a while  Protocols used: Coronavirus (COVID-19) Diagnosed or Suspected-A-AH

## 2024-07-27 ENCOUNTER — Encounter: Payer: Self-pay | Admitting: Family Medicine

## 2024-07-27 ENCOUNTER — Ambulatory Visit: Payer: 59 | Admitting: Family Medicine

## 2024-07-27 VITALS — BP 130/80 | HR 84 | Ht 62.0 in | Wt 105.4 lb

## 2024-07-27 DIAGNOSIS — Z0000 Encounter for general adult medical examination without abnormal findings: Secondary | ICD-10-CM | POA: Diagnosis not present

## 2024-07-27 DIAGNOSIS — E7800 Pure hypercholesterolemia, unspecified: Secondary | ICD-10-CM | POA: Diagnosis not present

## 2024-07-27 DIAGNOSIS — I10 Essential (primary) hypertension: Secondary | ICD-10-CM | POA: Diagnosis not present

## 2024-07-27 DIAGNOSIS — Z136 Encounter for screening for cardiovascular disorders: Secondary | ICD-10-CM | POA: Diagnosis not present

## 2024-07-27 NOTE — Progress Notes (Signed)
   Name: Terri Mahoney   Date of Visit: 07/27/24   Date of last visit with me: Visit date not found   CHIEF COMPLAINT:  Chief Complaint  Patient presents with   Annual Exam    Cpe. Nonfasting.        HPI:  Discussed the use of AI scribe software for clinical note transcription with the patient, who gave verbal consent to proceed.  History of Present Illness   Terri Mahoney is a 48 year old female who presents for a routine follow-up visit.  She does not have a blood pressure machine at home for regular monitoring. She has a family history of hypertension and maintains an active lifestyle with a healthy weight.  She recently had COVID-19 about four weeks ago, which she contracted along with her parents. This was her first time having COVID-19.  She has a history of anemia, managed with a 'one a day' women's multivitamin containing iron. Her anemia was not concerning at her last check-up.  She has a history of eczema, currently well-controlled with Cetaphil, Aveeno, and Gold Bond products. She avoids fragranced products to prevent flare-ups.  She experiences occasional nasal allergies, particularly with weather changes, but these are not frequent.         OBJECTIVE:       07/27/2024    8:08 AM  Depression screen PHQ 2/9  Decreased Interest 0  Down, Depressed, Hopeless 0  PHQ - 2 Score 0     BP Readings from Last 3 Encounters:  07/27/24 130/80  07/01/23 120/62  04/21/23 112/68    BP 130/80   Pulse 84   Ht 5' 2 (1.575 m)   Wt 105 lb 6.4 oz (47.8 kg)   SpO2 99%   BMI 19.28 kg/m    Physical Exam          Physical Exam Constitutional:      Appearance: Normal appearance.  Neurological:     General: No focal deficit present.     Mental Status: She is alert and oriented to person, place, and time. Mental status is at baseline.     ASSESSMENT/PLAN:   Assessment & Plan Annual physical exam  Hypercholesteremia  Encounter for screening for  cardiovascular disorders  Primary hypertension    Assessment and Plan    Elevated blood pressure Blood pressure should be <130/80 mmHg per cardiologist, likely genetic predisposition. - Recommend purchasing a blood pressure machine for periodic monitoring. - Discuss treatment if average readings >130/80 mmHg.  Anemia Recent labs normal. Multivitamin with iron likely sufficient. - Continue multivitamin with iron.  Eczema Well-controlled with current regimen. Avoidance of fragranced products effective. - Continue using Cetaphil, Aveeno, and Gold Bond. - Avoid fragranced products.  Allergic rhinitis Intermittent, mild symptoms with weather changes. - Manage symptoms as needed.  General Health Maintenance Routine screenings and vaccinations up to date. HIV screening required. - Conduct HIV screening during blood draw. - Provide prescription for COVID-19 vaccination. - Schedule mammogram. - Cologuard test next due in 2027.         Raydon Chappuis A. Vita MD [facility]

## 2024-07-28 ENCOUNTER — Ambulatory Visit: Payer: Self-pay | Admitting: Family Medicine

## 2024-07-28 LAB — CBC WITH DIFFERENTIAL/PLATELET
Basophils Absolute: 0 x10E3/uL (ref 0.0–0.2)
Basos: 1 %
EOS (ABSOLUTE): 0.1 x10E3/uL (ref 0.0–0.4)
Eos: 1 %
Hematocrit: 41.3 % (ref 34.0–46.6)
Hemoglobin: 12.9 g/dL (ref 11.1–15.9)
Immature Grans (Abs): 0 x10E3/uL (ref 0.0–0.1)
Immature Granulocytes: 0 %
Lymphocytes Absolute: 1 x10E3/uL (ref 0.7–3.1)
Lymphs: 28 %
MCH: 25.7 pg — ABNORMAL LOW (ref 26.6–33.0)
MCHC: 31.2 g/dL — ABNORMAL LOW (ref 31.5–35.7)
MCV: 82 fL (ref 79–97)
Monocytes Absolute: 0.3 x10E3/uL (ref 0.1–0.9)
Monocytes: 8 %
Neutrophils Absolute: 2.2 x10E3/uL (ref 1.4–7.0)
Neutrophils: 62 %
Platelets: 234 x10E3/uL (ref 150–450)
RBC: 5.02 x10E6/uL (ref 3.77–5.28)
RDW: 16 % — ABNORMAL HIGH (ref 11.7–15.4)
WBC: 3.5 x10E3/uL (ref 3.4–10.8)

## 2024-07-28 LAB — LIPID PANEL
Chol/HDL Ratio: 2.8 ratio (ref 0.0–4.4)
Cholesterol, Total: 237 mg/dL — ABNORMAL HIGH (ref 100–199)
HDL: 86 mg/dL (ref >39)
LDL Chol Calc (NIH): 140 mg/dL — ABNORMAL HIGH (ref 0–99)
Triglycerides: 67 mg/dL (ref 0–149)
VLDL Cholesterol Cal: 11 mg/dL (ref 5–40)

## 2024-07-28 LAB — BASIC METABOLIC PANEL WITH GFR
BUN/Creatinine Ratio: 16 (ref 9–23)
BUN: 15 mg/dL (ref 6–24)
CO2: 23 mmol/L (ref 20–29)
Calcium: 9.5 mg/dL (ref 8.7–10.2)
Chloride: 101 mmol/L (ref 96–106)
Creatinine, Ser: 0.93 mg/dL (ref 0.57–1.00)
Glucose: 77 mg/dL (ref 70–99)
Potassium: 4 mmol/L (ref 3.5–5.2)
Sodium: 140 mmol/L (ref 134–144)
eGFR: 76 mL/min/1.73 (ref >59)

## 2024-07-28 LAB — HIV ANTIBODY (ROUTINE TESTING W REFLEX): HIV Screen 4th Generation wRfx: NONREACTIVE

## 2025-07-31 ENCOUNTER — Encounter: Payer: Self-pay | Admitting: Family Medicine
